# Patient Record
Sex: Male | Born: 1985 | Hispanic: Yes | Marital: Married | Smoking: Current every day smoker
Health system: Southern US, Community
[De-identification: ages and names within clinical notes are randomized; demographics above are authoritative.]

---

## 2016-05-07 ENCOUNTER — Encounter (HOSPITAL_COMMUNITY): Payer: Self-pay | Admitting: Emergency Medicine

## 2016-05-07 ENCOUNTER — Ambulatory Visit (INDEPENDENT_AMBULATORY_CARE_PROVIDER_SITE_OTHER): Payer: Worker's Compensation

## 2016-05-07 ENCOUNTER — Ambulatory Visit (HOSPITAL_COMMUNITY)
Admission: EM | Admit: 2016-05-07 | Discharge: 2016-05-07 | Disposition: A | Discharge status: Home / Self Care | Payer: Self-pay | Attending: Family Medicine | Admitting: Family Medicine

## 2016-05-07 DIAGNOSIS — S62101A Fracture of unspecified carpal bone, right wrist, initial encounter for closed fracture: Secondary | ICD-10-CM | POA: Diagnosis not present

## 2016-05-07 DIAGNOSIS — S52122A Displaced fracture of head of left radius, initial encounter for closed fracture: Secondary | ICD-10-CM

## 2016-05-07 DIAGNOSIS — S52121A Displaced fracture of head of right radius, initial encounter for closed fracture: Secondary | ICD-10-CM | POA: Diagnosis not present

## 2016-05-07 MED ORDER — HYDROMORPHONE HCL 1 MG/ML IJ SOLN
1.0000 mg | Freq: Once | INTRAMUSCULAR | Status: AC
  Administered 2016-05-07: 1 mg via INTRAMUSCULAR

## 2016-05-07 MED ORDER — HYDROMORPHONE HCL 1 MG/ML IJ SOLN
INTRAMUSCULAR | Status: AC
  Filled 2016-05-07: qty 1

## 2016-05-07 MED ORDER — ONDANSETRON 4 MG PO TBDP
ORAL_TABLET | ORAL | Status: AC
  Filled 2016-05-07: qty 1

## 2016-05-07 MED ORDER — ONDANSETRON 4 MG PO TBDP
4.0000 mg | ORAL_TABLET | Freq: Once | ORAL | Status: AC
  Administered 2016-05-07: 4 mg via ORAL

## 2016-05-07 NOTE — Discharge Instructions (Signed)
Care and followup with dr Amanda Peagramig as advised. Call if any problems

## 2016-05-07 NOTE — ED Notes (Signed)
Ice packs refilled and applied

## 2016-05-07 NOTE — Progress Notes (Signed)
Orthopedic Tech Progress Note Patient Details:  Joseph Mccormick [birth year] [id number]  Ortho Devices Type of Ortho Device: Ace wrap, Post (long arm) splint Ortho Device/Splint Location: (B) UE Ortho Device/Splint Interventions: Ordered, Application   Jennye MoccasinHughes, [name] 05/07/2016, 8:34 PM

## 2016-05-07 NOTE — ED Notes (Signed)
Requesting pain medicine, notified provider

## 2016-05-07 NOTE — ED Provider Notes (Signed)
[facility]    CSN: [id number] Arrival date & time: 05/07/16  1326  First Provider Contact:  First MD Initiated Contact with Patient 05/07/16 1424        History   Chief Complaint Chief Complaint  Patient presents with  . Fall    HPI Joseph Mccormick is a 30 y.o. male.   The history is provided by the patient.  Fall  This is a new problem. The current episode started 1 to 2 hours ago (wearing stilts and at height of 36ft and landed forward  with injury to bilat wrists and elbowswith assoc deformity, hand nvt intact.). The problem has been gradually worsening. Pertinent negatives include no chest pain and no abdominal pain.    History reviewed. No pertinent past medical history.  There are no active problems to display for this patient.   History reviewed. No pertinent surgical history.     Home Medications    Prior to Admission medications   Not on File    Family History History reviewed. No pertinent family history.  Social History Social History  Substance Use Topics  . Smoking status: Current Every Day Smoker  . Smokeless tobacco: Never Used  . Alcohol use Yes     Allergies   Review of patient's allergies indicates no known allergies.   Review of Systems Review of Systems  Constitutional: Negative.   HENT: Negative.   Respiratory: Negative.   Cardiovascular: Negative.  Negative for chest pain.  Gastrointestinal: Negative.  Negative for abdominal pain.  Genitourinary: Negative.   Musculoskeletal: Positive for joint swelling. Negative for back pain and neck pain.  Skin: Negative.   Neurological: Negative.   All other systems reviewed and are negative.    Physical Exam Triage Vital Signs ED Triage Vitals  Enc Vitals Group     BP 05/07/16 1401 126/80     Pulse Rate 05/07/16 1401 84     Resp --      Temp 05/07/16 1401 98.9 F (37.2 C)     Temp Source 05/07/16 1401 Oral     SpO2 05/07/16 1401 98 %     Weight --    Height --      Head Circumference --      Peak Flow --      Pain Score 05/07/16 1422 8     Pain Loc --      Pain Edu? --      Excl. in GC? --    No data found.   Updated Vital Signs BP 126/80 (BP Location: Left Leg) Comment: unable to get due to pain in both arm from elbow to hands  Pulse 84   Temp 98.9 F (37.2 C) (Oral)   SpO2 98%   Visual Acuity Right Eye Distance:   Left Eye Distance:   Bilateral Distance:    Right Eye Near:   Left Eye Near:    Bilateral Near:     Physical Exam  Constitutional: He is oriented to person, place, and time. He appears well-developed and well-nourished. He appears distressed.  HENT:  Head: Normocephalic and atraumatic.  Neck: Normal range of motion. Neck supple.  Pulmonary/Chest: Effort normal and breath sounds normal. He exhibits no tenderness.  Abdominal: Soft. He exhibits no distension.  Musculoskeletal: He exhibits tenderness and deformity.  bilat wrist and elbow pain and deformity, distal hand nvt intact.shoulders, back , lower ext intact.  Neurological: He is alert and oriented to person, place, and time.  Skin: Skin is  warm and dry.  Nursing note and vitals reviewed.    UC Treatments / Results  Labs (all labs ordered are listed, but only abnormal results are displayed) Labs Reviewed - No data to display  EKG  EKG Interpretation None       Radiology No results found.  Procedures Procedures (including critical care time)  Medications Ordered in UC Medications - No data to display   Initial Impression / Assessment and Plan / UC Course  I have reviewed the triage vital signs and the nursing notes.  Pertinent labs & imaging results that were available during my care of the patient were reviewed by me and considered in my medical decision making (see chart for details).  Clinical Course      Final Clinical Impressions(s) / UC Diagnoses   Final diagnoses:  None    New Prescriptions New Prescriptions    No medications on file     Linna HoffJames D Adriana Quinby, MD 05/10/16 2122

## 2016-05-07 NOTE — ED Notes (Signed)
Paged ortho tech 

## 2016-05-07 NOTE — ED Triage Notes (Signed)
Patient reports fall occurred at noon today.  Patient was painting, patient was on stilts approximately 9 feet in height.  Patient fell, attempting to catch himself with both arms outstretched in front of him.  Landed on concrete.  Complains of pain in both arms.  Unwilling/unable to move eight elbow or either wrist.  Patient can move fingers, 2+radial pulses bilaterally.  Patient denies striking head, denies neck, back, hip, or knee pain.  Patient alert and oriented x 4.  Patient answers questions appropriately.  Skin warm and dry.  No visible bruises or abrasions.

## 2016-05-08 NOTE — Consult Note (Signed)
NAMErnestina Patches:  DAVILA-DELGADO, Saverio       ACCOUNT NO.:  [id number]  MEDICAL RECORD NO.:  [id number]  LOCATION:  [facility]                         FACILITY:  [facility]  PHYSICIAN:  Dionne AnoWilliam M. Shirelle Tootle, M.D.DATE OF BIRTH:  [birth year]  DATE OF CONSULTATION: DATE OF DISCHARGE:  05/07/2016                                CONSULTATION   HISTORY OF PRESENT ILLNESS:  Irving ShowsMiguel is a pleasant 30 year old male who fell approximately 9 feet today.  The patient presented to the urgent care with complaints of bilateral elbow and wrist pain.  X-rays have been reviewed.  Dr. Bradd CanaryJames Kindl, __________ see and treat him.  PAST MEDICAL HISTORY:  Reviewed.  He has no significant past medical history.  PAST SURGICAL HISTORY:  He has no significant past surgical history.  MEDICATIONS:  Reviewed __________.  SOCIAL HISTORY:  He is a smoker.  __________.  REVIEW OF SYSTEMS:  Negative for chest pain or abdominal pain.  HEENT, respiratory, and constitutional review of systems were negative.  Again, the patient has pain in the bilateral elbows.  There were no signs of __________.  No evidence of infection __________ vascular compromise.  At present juncture, he has __________ sensation to the upper arm, shoulder, and has no back, chest or abdominal pain.  Lower extremities examination is benign.  He is able to __________ examination.  At present juncture, there is no evidence of instability about his elbows, the wrist and he is tender over the dorsal right wrist.  The bilateral elbow has pain laterally and there is no evidence of glenohumeral __________ dislocation in the shoulder and elbow region.  X- rays were reviewed.  His x-rays of the elbow show bilateral __________ fractures, no evidence dislocation of the olecranon process appreciated.  I have reviewed these at length and the findings.  The patient __________.  He had examinations at his compartment which did not show any __________ elbow or upper arm.   __________ at length and the findings.  At present juncture, the __________ avulsion injury.  I reviewed this issues with the patient at length.  IMPRESSION: 1. Right radial neck fracture. 2. Right triquetral fracture about the wrist (carpal bone fracture,     mildly displaced). 3. Left radial neck fracture mildly displaced __________ treatment.  PLAN:  __________ showers and __________ ice, elevation.  A regime of oxycodone 1-2 q.4-6 hours p.r.n. pain p.o. 5 mg tablet and __________ q.6-8 hours p.r.n. __________.  I have recommended __________ and see me in 2 weeks for repeat x-rays.  He should be careful over the next 3-4 weeks __________ host of issues __________.  At the present juncture, __________ that he will do well with the regime, if not surgical clearance that would be __________.     Dionne AnoWilliam M. Amanda PeaGramig, M.D.     [facility]  D:  05/07/2016  T:  05/08/2016  Job:  [id number]

## 2016-05-26 NOTE — Consult Note (Signed)
Joseph Mccormick, Joseph Mccormick NO.:  [id number]  MEDICAL RECORD NO.:  [id number]  LOCATION:  [facility]                         FACILITY:  [facility]  PHYSICIAN:  Joseph Mccormick, M.D.DATE OF BIRTH:  [birth year]  DATE OF CONSULTATION: DATE OF DISCHARGE:  05/07/2016                                CONSULTATION   ADDENDUM:  This is a re-dictation of his note May 07, 2016.  I had the pleasure to see Joseph Mccormick in the emergency room upon the consultation request of Dr. Bradd Mccormick, May 07, 2016. This patient unfortunately fell and sustained bilateral elbow fractures as well as a left wrist fracture.  PAST MEDICAL HISTORY:  Not significant.  PAST SURGICAL HISTORY:  Not significant.  MEDICINES:  Reviewed.  SOCIAL HISTORY:  He is a smoker.  He does not participate in any illicit drug activity.  He is an active gentleman, who works and was injured on the job today.  ALLERGIES:  No known drug allergies.  REVIEW OF SYSTEMS:  Negative for chest pain, abdominal pain.  HEENT is within normal limits.  He has no lower extremity pain complaints and no back pain.  He complains of bilateral elbow pain and left wrist pain. His examination is reviewed.  His examination shows swelling in both elbows.  He has no evidence of mechanical block on gentle interval range of motion.  He has no evidence of medial elbow pain.  Pain is situated laterally in the bilateral exam. Pulses are normal.  No signs of compartment syndrome, dystrophy, or infection is appreciated.  He has stable ligamentous examination, which is a brief exam given the known fractures.  The olecranon are stable. There is no evidence of compromise to the skin architecture.  Bilateral shoulder examination is nontender.  Neurovascularly intact with good stability.  FDP, FDS, and extensor function are intact.  I have reviewed these issues with him at length and the findings.  His elbow exam radiographically is  consistent with a nondisplaced radial neck fracture on the right side and nondisplaced radial neck fracture on the left side.  The patient has an examination performed of his left wrist clinically.  Clinically, the left wrist has stable ligamentous examination with normal alignment stability and range of motion.  The right wrist has a small avulsion of the triquetral bone.  This is a right wrist carpal bone fracture.  I reviewed this with him at length.  I have reviewed the AP and lateral x-rays of bilateral right and left elbows as well as right and left forearms and the wrist.  These all point towards a minimally displaced triquetral fracture (carpal bone fracture) right wrist and bilateral radial neck fractures nondisplaced.  I have discussed with the patient these issues.  IMPRESSION: 1. Right triquetral fracture (carpal bone fracture) about the wrist. 2. Right radial neck fracture. 3. Left radial neck fracture about the elbow.  PLAN:  I discussed him his findings.  We spent greater than 60 minutes face-to-face time discussing the plans.  I placed him in bilateral long- arm splints.  Discussed with him doffing procedures to allow showers and necessary hygiene activities.  He was placed in the splint.  They were allowed to cure and then we discussed  and taught him donning and doffing techniques as well as other measures.  Following this, we wrote for appropriate pain medicine for him.  I would like to see him back in the office in a week to two weeks.  Take followup x-rays and move forward accordingly with a therapeutic approach and removable brace is being made at that time.  He tolerated today's visit nicely.  There were no complicating features.  Should issues arise he will notify us.  A regime of oxycodone 1-2 q.4-6 hours p.r.n. pain p.o. was written for the patient.  I do feel he will do well with a nonsurgical regime.  PHYSICAL EXAMINATION:  EXTREMITIES:  Lower  extremity examination, he is neurovascularly intact.  Normal __________ range of motion. CHEST:  Clear. HEENT:  Within normal limits. HEART:  Regular rate. ABDOMEN:  Nondistended, nontender.  I have reviewed the physical examination and performed a comprehensive 14-point review of systems.  Should problems arise I will be immediately available, otherwise.  We will move forward accordingly.     Joseph AnoWilliam M. Amanda PeaGramig, M.D.     [facility] CenterWM[facility]/MEDQ  D:  05/25/2016  T:  05/26/2016  Job:  [id number]

## 2018-04-23 IMAGING — DX DG WRIST COMPLETE 3+V*L*
3 series · 3 of 3 positions shown · non-contrast
Comparison: LEFT forearm radiographs 05/07/2016

CLINICAL DATA: Fall

EXAM:
LEFT WRIST - COMPLETE 3+ VIEW

[wrist pa]
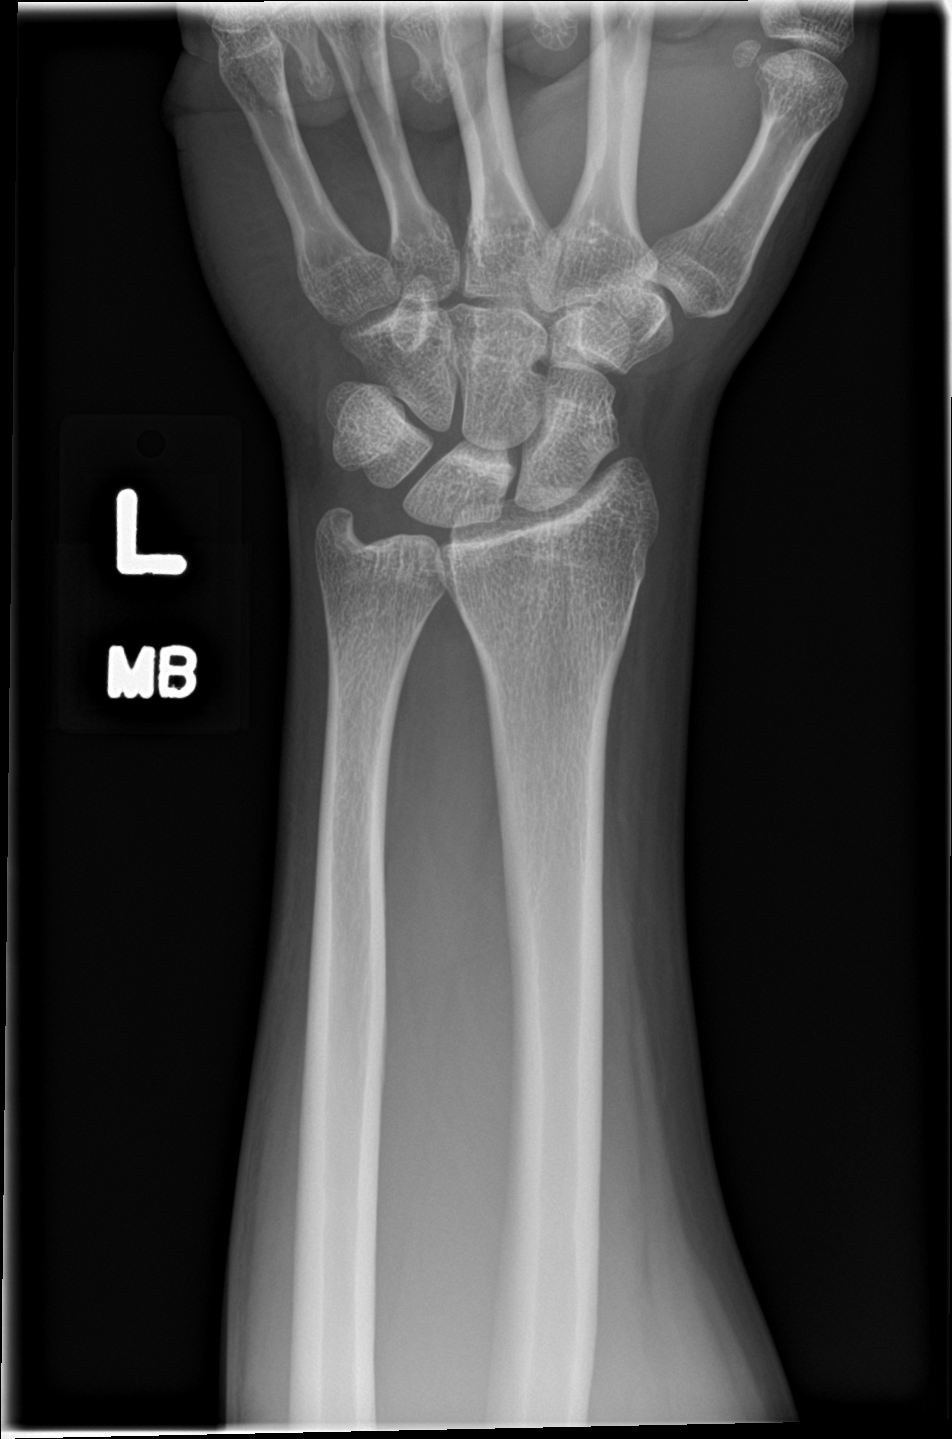

[wrist obl]
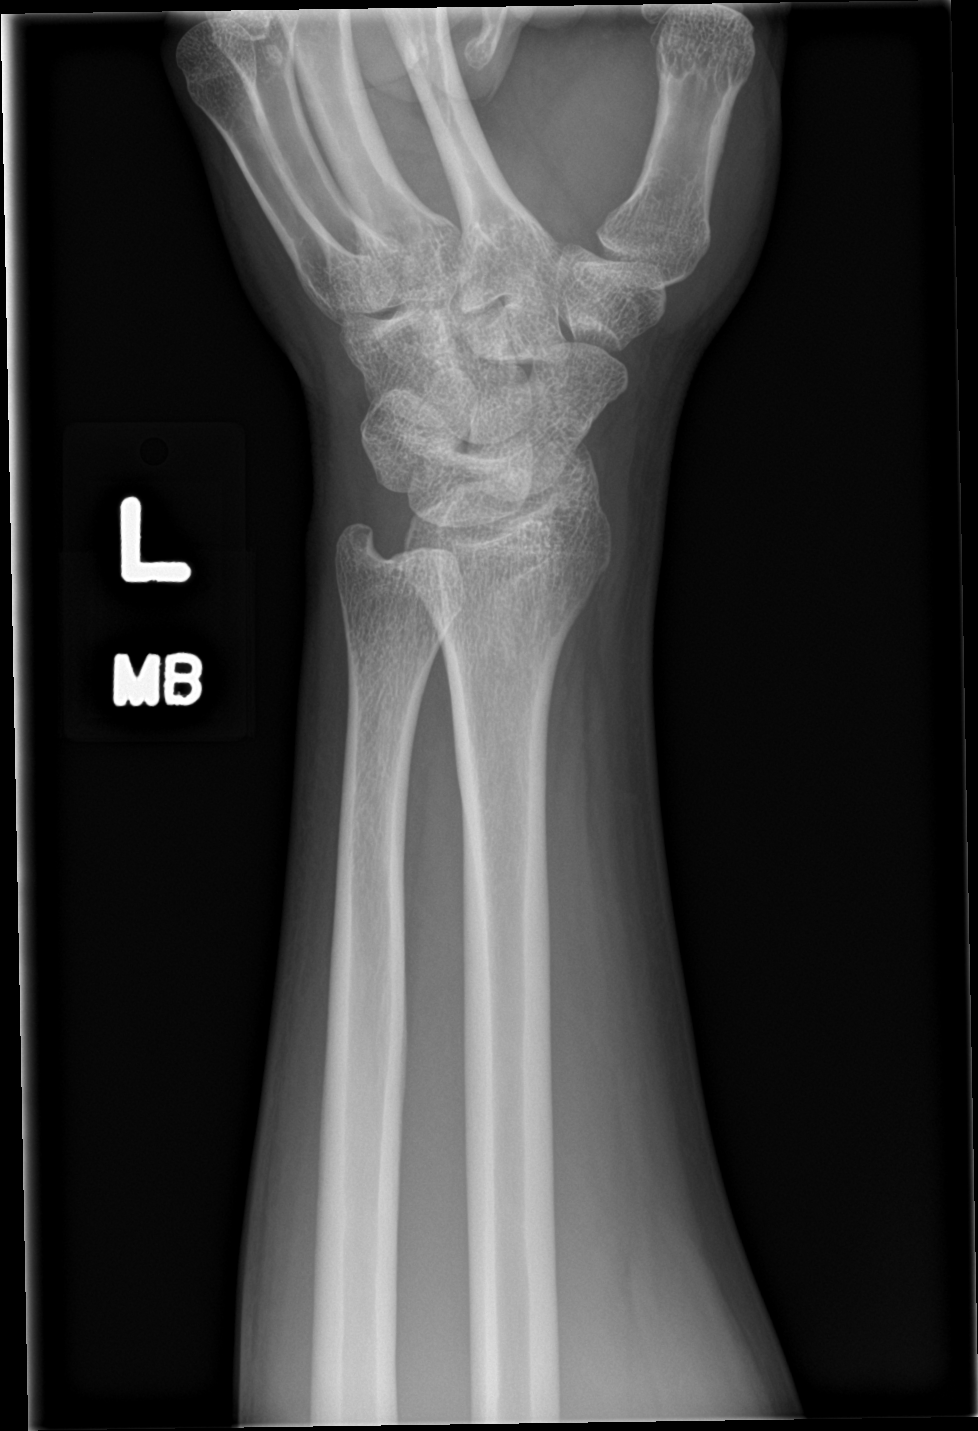

[wrist lat]
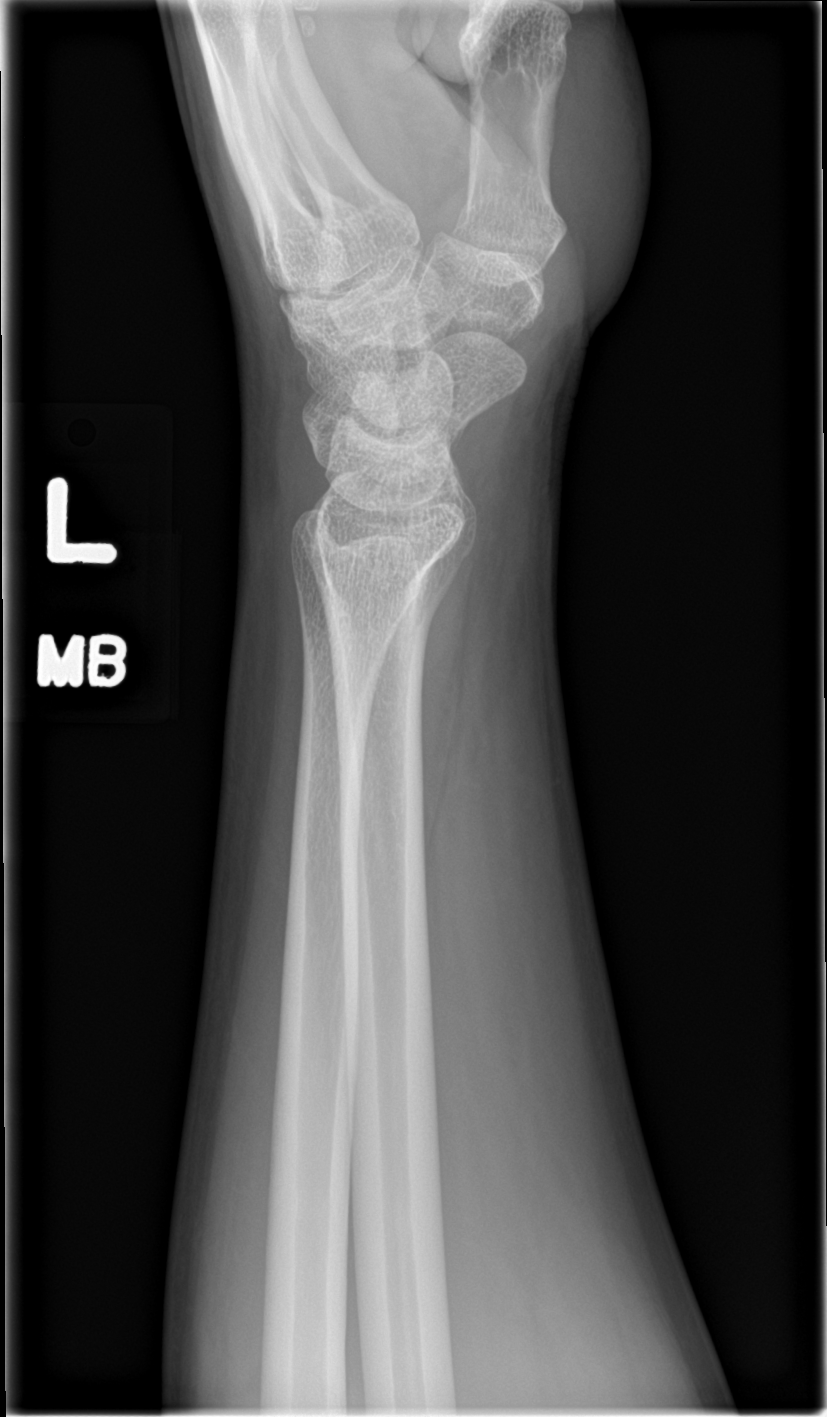

[3 of 3 positions shown; findings below may reference images not displayed]

FINDINGS: Osseous mineralization normal.

Joint spaces preserved.

No fracture, dislocation, or bone destruction.

No distal forearm abnormalities identified.
IMPRESSION: Normal exam.
